# Patient Record
Sex: Male | Born: 1985 | Race: Black or African American | Hispanic: No | Marital: Single | Smoking: Former smoker
Health system: Southern US, Community
[De-identification: ages and names within clinical notes are randomized; demographics above are authoritative.]

## PROBLEM LIST (undated history)

## (undated) DIAGNOSIS — F419 Anxiety disorder, unspecified: Secondary | ICD-10-CM

---
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
## 2004-10-06 ENCOUNTER — Emergency Department (HOSPITAL_COMMUNITY): Admission: EM | Admit: 2004-10-06 | Discharge: 2004-10-06 | Payer: Self-pay | Admitting: *Deleted

## 2005-07-19 ENCOUNTER — Emergency Department (HOSPITAL_COMMUNITY): Admission: EM | Admit: 2005-07-19 | Discharge: 2005-07-20 | Payer: Self-pay | Admitting: Emergency Medicine

## 2006-07-25 ENCOUNTER — Emergency Department (HOSPITAL_COMMUNITY): Admission: EM | Admit: 2006-07-25 | Discharge: 2006-07-25 | Payer: Self-pay | Admitting: Emergency Medicine

## 2007-11-29 ENCOUNTER — Emergency Department (HOSPITAL_COMMUNITY): Admission: EM | Admit: 2007-11-29 | Discharge: 2007-11-29 | Payer: Self-pay | Admitting: Emergency Medicine

## 2008-11-04 ENCOUNTER — Emergency Department (HOSPITAL_COMMUNITY): Admission: EM | Admit: 2008-11-04 | Discharge: 2008-11-04 | Payer: Self-pay | Admitting: Emergency Medicine

## 2008-11-17 ENCOUNTER — Emergency Department (HOSPITAL_COMMUNITY): Admission: EM | Admit: 2008-11-17 | Discharge: 2008-11-17 | Payer: Self-pay | Admitting: Emergency Medicine

## 2009-12-03 ENCOUNTER — Emergency Department (HOSPITAL_COMMUNITY): Admission: EM | Admit: 2009-12-03 | Discharge: 2009-12-03 | Payer: Self-pay | Admitting: Emergency Medicine

## 2011-02-26 LAB — CBC
HCT: 41.9
Hemoglobin: 14.3
MCHC: 34.2
MCV: 84.8
Platelets: 150
RBC: 4.94
RDW: 13.5
WBC: 6.5

## 2011-02-26 LAB — DIFFERENTIAL
Basophils Absolute: 0.1
Basophils Relative: 1
Eosinophils Absolute: 0.1
Eosinophils Relative: 2
Lymphocytes Relative: 27
Lymphs Abs: 1.8
Monocytes Absolute: 0.4
Monocytes Relative: 6
Neutro Abs: 4.1
Neutrophils Relative %: 64

## 2011-02-26 LAB — POCT I-STAT, CHEM 8
BUN: 15
Calcium, Ion: 1.17
Chloride: 106
Creatinine, Ser: 1.1
Glucose, Bld: 95
HCT: 45
Hemoglobin: 15.3
Potassium: 3.5
Sodium: 139
TCO2: 25

## 2012-03-28 ENCOUNTER — Observation Stay (HOSPITAL_COMMUNITY)
Admission: EM | Admit: 2012-03-28 | Discharge: 2012-03-28 | Disposition: A | Discharge status: Home / Self Care | Payer: Self-pay | Attending: Emergency Medicine | Admitting: Emergency Medicine

## 2012-03-28 ENCOUNTER — Encounter (HOSPITAL_COMMUNITY): Payer: Self-pay | Admitting: Cardiology

## 2012-03-28 DIAGNOSIS — L02519 Cutaneous abscess of unspecified hand: Principal | ICD-10-CM | POA: Insufficient documentation

## 2012-03-28 DIAGNOSIS — W269XXA Contact with unspecified sharp object(s), initial encounter: Secondary | ICD-10-CM | POA: Insufficient documentation

## 2012-03-28 DIAGNOSIS — L03019 Cellulitis of unspecified finger: Principal | ICD-10-CM | POA: Insufficient documentation

## 2012-03-28 DIAGNOSIS — Y929 Unspecified place or not applicable: Secondary | ICD-10-CM | POA: Insufficient documentation

## 2012-03-28 DIAGNOSIS — Y93G9 Activity, other involving cooking and grilling: Secondary | ICD-10-CM | POA: Insufficient documentation

## 2012-03-28 DIAGNOSIS — F17200 Nicotine dependence, unspecified, uncomplicated: Secondary | ICD-10-CM | POA: Insufficient documentation

## 2012-03-28 LAB — CBC
MCHC: 33.6 g/dL (ref 30.0–36.0)
Platelets: 189 10*3/uL (ref 150–400)
RDW: 13 % (ref 11.5–15.5)
WBC: 7 10*3/uL (ref 4.0–10.5)

## 2012-03-28 LAB — BASIC METABOLIC PANEL
BUN: 10 mg/dL (ref 6–23)
Chloride: 100 mEq/L (ref 96–112)
GFR calc Af Amer: >90 mL/min (ref >90)
GFR calc non Af Amer: >90 mL/min (ref >90)
Potassium: 3.5 mEq/L (ref 3.5–5.1)
Sodium: 137 mEq/L (ref 135–145)

## 2012-03-28 MED ORDER — SODIUM CHLORIDE 0.9 % IV SOLN
1000.0000 mL | Freq: Once | INTRAVENOUS | Status: AC
  Administered 2012-03-28: 1000 mL via INTRAVENOUS

## 2012-03-28 MED ORDER — HYDROCODONE-ACETAMINOPHEN 5-325 MG PO TABS: 1.0000 | ORAL_TABLET | ORAL | Status: DC | PRN

## 2012-03-28 MED ORDER — SODIUM CHLORIDE 0.9 % IV SOLN
1000.0000 mL | INTRAVENOUS | Status: DC
Start: 2012-03-28 — End: 2012-03-28

## 2012-03-28 MED ORDER — CLINDAMYCIN HCL 300 MG PO CAPS: 300.0000 mg | ORAL_CAPSULE | Freq: Four times a day (QID) | ORAL | Status: AC

## 2012-03-28 MED ORDER — CLINDAMYCIN PHOSPHATE 900 MG/50ML IV SOLN
900.0000 mg | Freq: Three times a day (TID) | INTRAVENOUS | Status: DC
Start: 2012-03-28 — End: 2012-03-28
  Administered 2012-03-28: 900 mg via INTRAVENOUS
  Filled 2012-03-28: qty 50

## 2012-03-28 MED ORDER — MORPHINE SULFATE 4 MG/ML IJ SOLN: 4.0000 mg | INTRAMUSCULAR | Status: DC | PRN

## 2012-03-28 NOTE — ED Notes (Signed)
Reports swelling to the right index finger that started last night. Pt with swelling and redness to the finger.

## 2012-03-28 NOTE — Progress Notes (Signed)
Utilization review completed.  

## 2012-03-28 NOTE — ED Notes (Signed)
Right index finger swollen, red and tender. No known injury. The pain is equal through out finger.

## 2012-03-28 NOTE — ED Provider Notes (Signed)
History    This chart was scribed for Seth Munch, MD, MD by Smitty Pluck. The patient was seen in room TR09C and the patient's care was started at 12:37PM.   CSN: [id number]  Arrival date & time 03/28/12  1151   None     Chief Complaint  Patient presents with  . Hand Pain    (Consider location/radiation/quality/duration/timing/severity/associated sxs/prior treatment) Patient is a 26 y.o. male presenting with hand pain. The history is provided by the patient. No language interpreter was used.  Hand Pain   Seth Davis is a 26 y.o. male who presents to the Emergency Department complaining of constant, moderate right index finger pain onset 3 days ago.  Pt reports cutting his finger while cleaning fish. He states that the pain radiates to other parts of hand. Pt reports having fever 1x but it has subsided. He denies nausea, vomiting and any other pain.      History reviewed. No pertinent past medical history.  History reviewed. No pertinent past surgical history.  History reviewed. No pertinent family history.  History  Substance Use Topics  . Smoking status: Current Some Day Smoker  . Smokeless tobacco: Not on file  . Alcohol Use: Yes      Review of Systems  Constitutional:       Per HPI, otherwise negative  HENT:       Per HPI, otherwise negative  Eyes: Negative.   Respiratory:       Per HPI, otherwise negative  Cardiovascular:       Per HPI, otherwise negative  Gastrointestinal: Negative for vomiting.  Genitourinary: Negative.   Musculoskeletal:       Per HPI, otherwise negative  Skin: Negative.   Neurological: Negative for syncope.    Allergies  Review of patient's allergies indicates no known allergies.  Home Medications   Current Outpatient Rx  Name Route Sig Dispense Refill  . ACETAMINOPHEN 500 MG PO TABS Oral Take 1,000 mg by mouth every 6 (six) hours as needed. For pain      BP 115/81  Pulse 83  Temp 98.3 F (36.8 C)  Resp 18  SpO2  97%  Physical Exam  Nursing note and vitals reviewed. Constitutional: He is oriented to person, place, and time. He appears well-developed and well-nourished. No distress.  HENT:  Head: Normocephalic and atraumatic.  Eyes: EOM are normal.  Neck: Neck supple. No tracheal deviation present.  Cardiovascular: Normal rate.   Pulmonary/Chest: Effort normal. No respiratory distress.  Musculoskeletal: Normal range of motion.       Pain on middle phalanx with less on proximal No drainage  Flex and extension of distal phalanx minimaly Pain on dorsal phalanx   Neurological: He is alert and oriented to person, place, and time.  Skin: Skin is warm and dry.  Psychiatric: He has a normal mood and affect. His behavior is normal.    ED Course  Procedures (including critical care time)  COORDINATION OF CARE: 1:01 PM Discussed ED treatment with pt     Labs Reviewed - No data to display No results found.   No diagnosis found.    MDM  I personally performed the services described in this documentation, which was scribed in my presence. The recorded information has been reviewed and considered.  This previously well young male presents several days after sustaining a laceration to the right index finger.  On exam the patient is in no distress.  The patient's flexion and extension capacity of all  joints on the affected digit, as well as the remainder of his hand.  There is edema, erythema, no tenderness the patient proximal to the digit, which is reassuring for the lack of current tendonous sheath involvement. Given the location of the wound, the patient was placed in the CDU under the cellulitis protocol.  Expectation is for reassessment after antibiotics with consideration of either ED hand consult for a lack of improvement or discharge with hand surgery follow-up if he improves, as anticipated.  Seth Munch, MD 03/28/12 [phone]

## 2012-03-28 NOTE — ED Provider Notes (Signed)
2:10 PM Pt here with swelling and pain to the right index finger starting 2 days ago. States has a small scratch to the top of the finger, but no other injuries. Denies fever, chills. Able to move finger all the way, with pain. Swelling extends over entire finger.   Filed Vitals:   03/28/12 1356  BP: 123/78  Pulse: 71  Temp: 98.3 F (36.8 C)  Resp: 20   Exam: Pt in NAD. AAOx3. PERRLA. Neck is supple. Regular HR and rhythm. Lungs are clear to auscultation bilaterally. Swelling noted to the right index finger, it is erythemous, tender over all phalanxes. NO tenderness to the MCP joint or the hand.   Pt has fluids, and clindamycin ordered. Plan to watch for 8 hrs, d/c home with oral antibiotics if improving. Pt is non toxic.   3:39 PM Pt signed out to the next provider at shift change.    Lottie Mussel, PA 03/28/12 1540

## 2012-03-28 NOTE — ED Provider Notes (Signed)
Please see my initial note.  I transfered the patient to the CDU under the cellulitis protocol.  Gerhard Munch, MD 03/28/12 [phone]

## 2012-03-28 NOTE — ED Provider Notes (Signed)
4:33 PM Patient is in CDU under observation, cellulitis protocol.  He has received his first dose of clindamycin at 2:00pm today.  Pt reports his finger is "about the same" but notes that he can move it much more now, and that it definitely is not getting worse.  I discussed with him the plan to stay for second dose of antibiotics around 10pm and he states he would much prefer to go home, does not want to stay.  Pt does not have health insurance, may need help filling clindamycin.  I have paged the case manager to see if he qualifies for the indigent fund.  Right index finger is erythematous, warm, tender to palpation.  He is able to bend his finger at each joint and reports greater ROM than before.  He does not have any spread past the base of his finger.  Will attempt to help get prescription filled, if pt continues to do well, may d/c prior to second dose per his request.    6:50 PM Patient does qualify for indigent fund with pharmacy, prescription to be filled downstairs prior to discharge home.  Pt continues to desire this plan, does not want to stay for second IV dose.    7:56 PM Pt showing improvement in ROM of finger, no worsening of appearance of finger or in patient's symptoms.  Pt continues to want to be discharged.  Meds received from pharmacy.  Pt informed of need for follow up in 2 days, will give hand surgery referral.  Pt informed he should come here if he is unable to see hand specialist for recheck.  Discussed return precautions.  Pt verbalizes understanding and agrees with plan.    Results for orders placed during the hospital encounter of 03/28/12  CBC      Component Value Range   WBC 7.0  4.0 - 10.5 K/uL   RBC 5.16  4.22 - 5.81 MIL/uL   Hemoglobin 14.7  13.0 - 17.0 g/dL   HCT 96.0  45.4 - 09.8 %   MCV 84.7  78.0 - 100.0 fL   MCH 28.5  26.0 - 34.0 pg   MCHC 33.6  30.0 - 36.0 g/dL   RDW 11.9  14.7 - 82.9 %   Platelets 189  150 - 400 K/uL  BASIC METABOLIC PANEL      Component  Value Range   Sodium 137  135 - 145 mEq/L   Potassium 3.5  3.5 - 5.1 mEq/L   Chloride 100  96 - 112 mEq/L   CO2 25  19 - 32 mEq/L   Glucose, Bld 135 (*) 70 - 99 mg/dL   BUN 10  6 - 23 mg/dL   Creatinine, Ser 5.62  0.50 - 1.35 mg/dL   Calcium 9.2  8.4 - 13.0 mg/dL   GFR calc non Af Amer >90  >90 mL/min   GFR calc Af Amer >90  >90 mL/min   No results found.    [place], [place] 03/28/12 [phone]

## 2012-03-29 NOTE — Care Management Note (Signed)
    Page 1 of 1   03/29/2012     8:26:57 AM   CARE MANAGEMENT NOTE 03/29/2012  Patient:  Seth Davis, Seth Davis   Account Number:  [id number]  Date Initiated:  03/29/2012  Documentation initiated by:  Jim Like  Subjective/Objective Assessment:     Action/Plan:   Medication assistance   Anticipated DC Date:  03/28/2012   Anticipated DC Plan:  HOME/SELF CARE      DC Planning Services  Medication Assistance      Choice offered to / List presented to:             Status of service:   Medicare Important Message given?   (If response is "NO", the following Medicare IM given date fields will be blank) Date Medicare IM given:   Date Additional Medicare IM given:    Discharge Disposition:    Per UR Regulation:    If discussed at Long Length of Stay Meetings, dates discussed:    Comments:  03/28/12 8:20 Call from Fernley PA in the ED requesting medication assistance for Clindamycin 300 mg po.  Per pharmacy pt is eligible for [facility] fund assistance.  Jim Like RN CCM [facility].

## 2012-03-29 NOTE — ED Provider Notes (Signed)
I was available during the early portion of the patient's CDU course. Chest to the CDU with concern for cellulitis due to finger infection.    Gerhard Munch, MD 03/29/12 [phone]

## 2012-04-11 ENCOUNTER — Emergency Department (HOSPITAL_COMMUNITY)
Admission: EM | Admit: 2012-04-11 | Discharge: 2012-04-11 | Disposition: A | Discharge status: Home / Self Care | Payer: Self-pay | Attending: Emergency Medicine | Admitting: Emergency Medicine

## 2012-04-11 ENCOUNTER — Encounter (HOSPITAL_COMMUNITY): Payer: Self-pay | Admitting: Family Medicine

## 2012-04-11 DIAGNOSIS — R197 Diarrhea, unspecified: Secondary | ICD-10-CM | POA: Insufficient documentation

## 2012-04-11 DIAGNOSIS — R109 Unspecified abdominal pain: Secondary | ICD-10-CM

## 2012-04-11 DIAGNOSIS — F17200 Nicotine dependence, unspecified, uncomplicated: Secondary | ICD-10-CM | POA: Insufficient documentation

## 2012-04-11 DIAGNOSIS — Z79899 Other long term (current) drug therapy: Secondary | ICD-10-CM | POA: Insufficient documentation

## 2012-04-11 DIAGNOSIS — R1084 Generalized abdominal pain: Secondary | ICD-10-CM | POA: Insufficient documentation

## 2012-04-11 LAB — CBC WITH DIFFERENTIAL/PLATELET
Basophils Absolute: 0 10*3/uL (ref 0.0–0.1)
Basophils Relative: 0 % (ref 0–1)
HCT: 45.8 % (ref 39.0–52.0)
Hemoglobin: 15.4 g/dL (ref 13.0–17.0)
Lymphocytes Relative: 30 % (ref 12–46)
MCHC: 33.6 g/dL (ref 30.0–36.0)
Monocytes Absolute: 0.3 10*3/uL (ref 0.1–1.0)
Monocytes Relative: 4 % (ref 3–12)
Neutro Abs: 3.9 10*3/uL (ref 1.7–7.7)
Neutrophils Relative %: 62 % (ref 43–77)
WBC: 6.3 10*3/uL (ref 4.0–10.5)

## 2012-04-11 LAB — URINALYSIS, ROUTINE W REFLEX MICROSCOPIC
Bilirubin Urine: NEGATIVE
Glucose, UA: NEGATIVE mg/dL
Ketones, ur: NEGATIVE mg/dL
Protein, ur: NEGATIVE mg/dL
pH: 5.5 (ref 5.0–8.0)

## 2012-04-11 LAB — COMPREHENSIVE METABOLIC PANEL
AST: 22 U/L (ref 0–37)
Albumin: 4 g/dL (ref 3.5–5.2)
Alkaline Phosphatase: 43 U/L (ref 39–117)
BUN: 9 mg/dL (ref 6–23)
CO2: 28 mEq/L (ref 19–32)
Chloride: 101 mEq/L (ref 96–112)
GFR calc non Af Amer: >90 mL/min (ref >90)
Potassium: 4.5 mEq/L (ref 3.5–5.1)
Total Bilirubin: 0.4 mg/dL (ref 0.3–1.2)

## 2012-04-11 MED ORDER — LOPERAMIDE HCL 2 MG PO CAPS
4.0000 mg | ORAL_CAPSULE | Freq: Once | ORAL | Status: AC
  Administered 2012-04-11: 4 mg via ORAL
  Filled 2012-04-11: qty 2

## 2012-04-11 MED ORDER — METRONIDAZOLE 500 MG PO TABS: 500.0000 mg | ORAL_TABLET | Freq: Three times a day (TID) | ORAL | Status: DC

## 2012-04-11 MED ORDER — METOCLOPRAMIDE HCL 10 MG PO TABS: 10.0000 mg | ORAL_TABLET | Freq: Four times a day (QID) | ORAL | Status: DC | PRN

## 2012-04-11 MED ORDER — METRONIDAZOLE 500 MG PO TABS
500.0000 mg | ORAL_TABLET | Freq: Once | ORAL | Status: AC
  Administered 2012-04-11: 500 mg via ORAL
  Filled 2012-04-11: qty 1

## 2012-04-11 NOTE — ED Notes (Signed)
Per pt 3 days of mid abdominal pain with diarrhea and nausea. sts watery and has went about 7 times.

## 2012-04-11 NOTE — ED Provider Notes (Signed)
History     CSN: [id number]  Arrival date & time 04/11/12  1337   First MD Initiated Contact with Patient 04/11/12 1739      Chief Complaint  Patient presents with  . Abdominal Pain    (Consider location/radiation/quality/duration/timing/severity/associated sxs/prior treatment) Patient is a 26 y.o. male presenting with abdominal pain. The history is provided by the patient.  Abdominal Pain The primary symptoms of the illness include abdominal pain.  He has been having crampy abdominal pain for about the last 2 weeks. Pain is generalized and not affected by anything that he does. He started having diarrhea today and pain is not better or worse with diarrhea. There has been associated nausea without vomiting. He denies fever, chills, sweats. Note, he was in the CDU here for cellulitis of his finger and it and sent home on clindamycin and he also states that he had been given a prescription for amoxicillin about 2 months ago. He is taking over-the-counter analgesics with no relief.   History reviewed. No pertinent past medical history.  History reviewed. No pertinent past surgical history.  History reviewed. No pertinent family history.  History  Substance Use Topics  . Smoking status: Current Some Day Smoker  . Smokeless tobacco: Not on file  . Alcohol Use: Yes      Review of Systems  Gastrointestinal: Positive for abdominal pain.  All other systems reviewed and are negative.    Allergies  Review of patient's allergies indicates no known allergies.  Home Medications   Current Outpatient Rx  Name  Route  Sig  Dispense  Refill  . HYDROCODONE-ACETAMINOPHEN 5-325 MG PO TABS   Oral   Take 1-2 tablets by mouth every 4 (four) hours as needed for pain.   20 tablet   0   . METOCLOPRAMIDE HCL 10 MG PO TABS   Oral   Take 1 tablet (10 mg total) by mouth every 6 (six) hours as needed (nausea).   30 tablet   0   . METRONIDAZOLE 500 MG PO TABS   Oral   Take 1 tablet (500  mg total) by mouth 3 (three) times daily.   30 tablet   0     BP 126/66  Pulse 61  Temp 97.9 F (36.6 C)  Resp 18  SpO2 97%  Physical Exam  Nursing note and vitals reviewed. 26 year old male, resting comfortably and in no acute distress. Vital signs are normal. Oxygen saturation is 99%, which is normal. Head is normocephalic and atraumatic. PERRLA, EOMI. Oropharynx is clear. Neck is nontender and supple without adenopathy or JVD. Back is nontender and there is no CVA tenderness. Lungs are clear without rales, wheezes, or rhonchi. Chest is nontender. Heart has regular rate and rhythm without murmur. Abdomen is soft, flat, nontender without masses or hepatosplenomegaly and peristalsis is normoactive. Extremities have no cyanosis or edema, full range of motion is present. Skin is warm and dry without rash. Neurologic: Mental status is normal, cranial nerves are intact, there are no motor or sensory deficits.   ED Course  Procedures (including critical care time)  Results for orders placed during the hospital encounter of 04/11/12  CBC WITH DIFFERENTIAL      Component Value Range   WBC 6.3  4.0 - 10.5 K/uL   RBC 5.46  4.22 - 5.81 MIL/uL   Hemoglobin 15.4  13.0 - 17.0 g/dL   HCT 28.4  13.2 - 44.0 %   MCV 83.9  78.0 - 100.0 fL  MCH 28.2  26.0 - 34.0 pg   MCHC 33.6  30.0 - 36.0 g/dL   RDW 95.6  21.3 - 08.6 %   Platelets 209  150 - 400 K/uL   Neutrophils Relative 62  43 - 77 %   Neutro Abs 3.9  1.7 - 7.7 K/uL   Lymphocytes Relative 30  12 - 46 %   Lymphs Abs 1.9  0.7 - 4.0 K/uL   Monocytes Relative 4  3 - 12 %   Monocytes Absolute 0.3  0.1 - 1.0 K/uL   Eosinophils Relative 4  0 - 5 %   Eosinophils Absolute 0.3  0.0 - 0.7 K/uL   Basophils Relative 0  0 - 1 %   Basophils Absolute 0.0  0.0 - 0.1 K/uL  COMPREHENSIVE METABOLIC PANEL      Component Value Range   Sodium 139  135 - 145 mEq/L   Potassium 4.5  3.5 - 5.1 mEq/L   Chloride 101  96 - 112 mEq/L   CO2 28  19 - 32  mEq/L   Glucose, Bld 87  70 - 99 mg/dL   BUN 9  6 - 23 mg/dL   Creatinine, Ser 5.78  0.50 - 1.35 mg/dL   Calcium 9.9  8.4 - 46.9 mg/dL   Total Protein 7.4  6.0 - 8.3 g/dL   Albumin 4.0  3.5 - 5.2 g/dL   AST 22  0 - 37 U/L   ALT 14  0 - 53 U/L   Alkaline Phosphatase 43  39 - 117 U/L   Total Bilirubin 0.4  0.3 - 1.2 mg/dL   GFR calc non Af Amer >90  >90 mL/min   GFR calc Af Amer >90  >90 mL/min  LIPASE, BLOOD      Component Value Range   Lipase 21  11 - 59 U/L  URINALYSIS, ROUTINE W REFLEX MICROSCOPIC      Component Value Range   Color, Urine YELLOW  YELLOW   APPearance CLEAR  CLEAR   Specific Gravity, Urine 1.019  1.005 - 1.030   pH 5.5  5.0 - 8.0   Glucose, UA NEGATIVE  NEGATIVE mg/dL   Hgb urine dipstick NEGATIVE  NEGATIVE   Bilirubin Urine NEGATIVE  NEGATIVE   Ketones, ur NEGATIVE  NEGATIVE mg/dL   Protein, ur NEGATIVE  NEGATIVE mg/dL   Urobilinogen, UA 0.2  0.0 - 1.0 mg/dL   Nitrite NEGATIVE  NEGATIVE   Leukocytes, UA NEGATIVE  NEGATIVE     1. Abdominal cramping   2. Diarrhea       MDM  Crampy abdominal pain and diarrhea following 2 courses of antibiotics. Exam is benign and WBC is normal. This is most likely antibiotic associated diarrhea, possibly Clostridium difficile. He will be treated with metronidazole and also given a prescription for metoclopramide for nausea. He is to return should symptoms worsen.         Dione Booze, MD 04/11/12 [phone]

## 2012-04-11 NOTE — ED Notes (Signed)
Pt reports abdominal pain/cramping x 3 wks. States that diarrhea has been intermittent x 1 week and has gotten worse today. States that abdominal pain is 8/10 when cramping starts. Reports nausea with no vomiting. Pt reports headache and back pain with abdominal pain. Pt denies dysuria or penile discharge or odor.

## 2012-08-18 ENCOUNTER — Emergency Department (HOSPITAL_COMMUNITY): Payer: Self-pay

## 2012-08-18 ENCOUNTER — Encounter (HOSPITAL_COMMUNITY): Payer: Self-pay | Admitting: Emergency Medicine

## 2012-08-18 ENCOUNTER — Emergency Department (HOSPITAL_COMMUNITY)
Admission: EM | Admit: 2012-08-18 | Discharge: 2012-08-18 | Disposition: A | Discharge status: Home / Self Care | Payer: Self-pay | Attending: Emergency Medicine | Admitting: Emergency Medicine

## 2012-08-18 DIAGNOSIS — Z8659 Personal history of other mental and behavioral disorders: Secondary | ICD-10-CM | POA: Insufficient documentation

## 2012-08-18 DIAGNOSIS — R51 Headache: Secondary | ICD-10-CM | POA: Insufficient documentation

## 2012-08-18 DIAGNOSIS — F17200 Nicotine dependence, unspecified, uncomplicated: Secondary | ICD-10-CM | POA: Insufficient documentation

## 2012-08-18 DIAGNOSIS — R071 Chest pain on breathing: Secondary | ICD-10-CM | POA: Insufficient documentation

## 2012-08-18 HISTORY — DX: Anxiety disorder, unspecified: F41.9

## 2012-08-18 LAB — CBC
HCT: 41 % (ref 39.0–52.0)
Hemoglobin: 14 g/dL (ref 13.0–17.0)
MCH: 28.4 pg (ref 26.0–34.0)
MCV: 83.2 fL (ref 78.0–100.0)
RBC: 4.93 MIL/uL (ref 4.22–5.81)
WBC: 5.5 10*3/uL (ref 4.0–10.5)

## 2012-08-18 LAB — BASIC METABOLIC PANEL
CO2: 28 mEq/L (ref 19–32)
Chloride: 105 mEq/L (ref 96–112)
Glucose, Bld: 92 mg/dL (ref 70–99)
Sodium: 141 mEq/L (ref 135–145)

## 2012-08-18 MED ORDER — DIPHENHYDRAMINE HCL 50 MG/ML IJ SOLN
50.0000 mg | Freq: Once | INTRAMUSCULAR | Status: AC
  Administered 2012-08-18: 50 mg via INTRAMUSCULAR
  Filled 2012-08-18: qty 1

## 2012-08-18 MED ORDER — KETOROLAC TROMETHAMINE 60 MG/2ML IM SOLN
60.0000 mg | Freq: Once | INTRAMUSCULAR | Status: AC
  Administered 2012-08-18: 60 mg via INTRAMUSCULAR
  Filled 2012-08-18: qty 2

## 2012-08-18 MED ORDER — METOCLOPRAMIDE HCL 5 MG/ML IJ SOLN
10.0000 mg | Freq: Once | INTRAMUSCULAR | Status: AC
  Administered 2012-08-18: 10 mg via INTRAMUSCULAR
  Filled 2012-08-18: qty 2

## 2012-08-18 NOTE — ED Notes (Signed)
Pt comes up to Nurse First and reports he was asleep and did not hear his named called.  Pt reports he is going out to his car but will be back.

## 2012-08-18 NOTE — ED Notes (Signed)
Pt called for room x3 no answer 

## 2012-08-18 NOTE — ED Notes (Signed)
Pt reports headaches and dizziness for the last 3-4days. Pt reports pain is intermittent and has increased in frequency. Pt states today he developed throbbing pain in his substernal chest with some mild SOB and decided to come to the ER. Pt A&Ox4. VSS. NAD.

## 2012-08-18 NOTE — ED Provider Notes (Signed)
History     CSN: [id number]  Arrival date & time 08/18/12  1114   First MD Initiated Contact with Patient 08/18/12 1506      Chief Complaint  Patient presents with  . Chest Pain  . Headache     HPI Pt was seen at 1520.   Per pt, c/o gradual onset and persistence of constant left sided "headache" that began 3 to 4 days ago.  Describes the headache as "throbbing."  Has been associated with sinus congestion.  Pt also c/o gradual onset and resolution of one brief episode of chest "pain" that woke him up from sleep this morning at 0700.  Pt states it lasted only a few minutes before resolving.  Denies palpitations, no SOB/cough, no abd pain, no N/V/D, no back pain, no visual changes, no focal motor weakness, no tingling/numbness in extremities, no fevers, no rash. Denies headache was sudden or maximal at onset or at any time.    Past Medical History  Diagnosis Date  . Anxiety     History reviewed. No pertinent past surgical history.   History  Substance Use Topics  . Smoking status: Current Some Day Smoker  . Smokeless tobacco: Not on file  . Alcohol Use: Yes    Review of Systems ROS: Statement: All systems negative except as marked or noted in the HPI; Constitutional: Negative for fever and chills. ; ; Eyes: Negative for eye pain, redness and discharge. ; ; ENMT: Negative for ear pain, hoarseness, nasal congestion, sore throat. +sinus pressure. ; ; Cardiovascular: +CP. Negative for palpitations, diaphoresis, dyspnea and peripheral edema. ; ; Respiratory: Negative for cough, wheezing and stridor. ; ; Gastrointestinal: Negative for nausea, vomiting, diarrhea, abdominal pain, blood in stool, hematemesis, jaundice and rectal bleeding. . ; ; Genitourinary: Negative for dysuria, flank pain and hematuria. ; ; Musculoskeletal: Negative for back pain and neck pain. Negative for swelling and trauma.; ; Skin: Negative for pruritus, rash, abrasions, blisters, bruising and skin lesion.; ; Neuro:  +headache. Negative for lightheadedness and neck stiffness. Negative for weakness, altered level of consciousness , altered mental status, extremity weakness, paresthesias, involuntary movement, seizure and syncope.       Allergies  Review of patient's allergies indicates no known allergies.  Home Medications   Current Outpatient Rx  Name  Route  Sig  Dispense  Refill  . ibuprofen (ADVIL,MOTRIN) 200 MG tablet   Oral   Take 800 mg by mouth every 6 (six) hours as needed for pain.           BP 117/66  Pulse 60  Temp(Src) 97.8 F (36.6 C) (Oral)  Resp 16  Ht 5\' 10"  (1.778 m)  Wt 150 lb (68.04 kg)  BMI 21.52 kg/m2  SpO2 100%  Physical Exam 1525: Physical examination:  Nursing notes reviewed; Vital signs and O2 SAT reviewed;  Constitutional: Well developed, Well nourished, Well hydrated, In no acute distress; Head:  Normocephalic, atraumatic; Eyes: EOMI, PERRL, No scleral icterus; ENMT: TM's clear bilat. +edemetous nasal turbinates bilat with clear rhinorrhea.  Mouth and pharynx normal, Mucous membranes moist; Neck: Supple, Full range of motion, No lymphadenopathy; Cardiovascular: Regular rate and rhythm, No murmur, rub, or gallop; Respiratory: Breath sounds clear & equal bilaterally, No rales, rhonchi, wheezes.  Speaking full sentences with ease, Normal respiratory effort/excursion; Chest: Nontender, Movement normal; Abdomen: Soft, Nontender, Nondistended, Normal bowel sounds;; Extremities: Pulses normal, No tenderness, No edema, No calf edema or asymmetry.; Neuro: AA&Ox3, Major CN grossly intact.  Speech clear. No gross  focal motor or sensory deficits in extremities. Climbs on and off stretcher easily by himself. Gait steady.; Skin: Color normal, Warm, Dry.Psych:  Anxious.    ED Course  Procedures     MDM  MDM Reviewed: previous chart, nursing note and vitals Interpretation: labs, ECG, x-ray and CT scan    Date: 08/18/2012  Rate: 73  Rhythm: normal sinus rhythm and sinus  arrhythmia  QRS Axis: normal  Intervals: normal  ST/T Wave abnormalities: normal  Conduction Disutrbances:none  Narrative Interpretation:   Old EKG Reviewed: none available  Results for orders placed during the hospital encounter of 08/18/12  BASIC METABOLIC PANEL      Result Value Range   Sodium 141  135 - 145 mEq/L   Potassium 3.7  3.5 - 5.1 mEq/L   Chloride 105  96 - 112 mEq/L   CO2 28  19 - 32 mEq/L   Glucose, Bld 92  70 - 99 mg/dL   BUN 8  6 - 23 mg/dL   Creatinine, Ser 4.09  0.50 - 1.35 mg/dL   Calcium 9.3  8.4 - 81.1 mg/dL   GFR calc non Af Amer >90  >90 mL/min   GFR calc Af Amer >90  >90 mL/min  CBC      Result Value Range   WBC 5.5  4.0 - 10.5 K/uL   RBC 4.93  4.22 - 5.81 MIL/uL   Hemoglobin 14.0  13.0 - 17.0 g/dL   HCT 91.4  78.2 - 95.6 %   MCV 83.2  78.0 - 100.0 fL   MCH 28.4  26.0 - 34.0 pg   MCHC 34.1  30.0 - 36.0 g/dL   RDW 21.3  08.6 - 57.8 %   Platelets 174  150 - 400 K/uL  POCT I-STAT TROPONIN I      Result Value Range   Troponin i, poc 0.01  0.00 - 0.08 ng/mL   Comment 3            Dg Chest 2 View 08/18/2012  *RADIOLOGY REPORT*  Clinical Data: Chest pain and pressure.  CHEST - 2 VIEW  Comparison: 11/29/2007  Findings: Normal heart, mediastinal, and hilar contours.  Normal pulmonary vascularity.  The lungs are well expanded and clear. Negative for pleural effusion.  No acute osseous abnormality identified.  IMPRESSION: No acute cardiopulmonary disease.   Original Report Authenticated By: Britta Mccreedy, M.D.   Ct Head Wo Contrast 08/18/2012  *RADIOLOGY REPORT*  Clinical Data: Headaches and dizziness.  CT HEAD WITHOUT CONTRAST  Technique:  Contiguous axial images were obtained from the base of the skull through the vertex without contrast.  Comparison: None.  Findings: No acute infarct, hemorrhage, or mass lesion is present. The ventricles are normal size.  No significant extra-axial fluid collection is present.  The paranasal sinuses and mastoid air cells are  clear.  The osseous skull is intact.  IMPRESSION: Negative CT of the head.   Original Report Authenticated By: Marin Roberts, M.D.     [phone]:  Pt states he feels "much better now" and wants to go home.  Has gotten himself dressed and is sitting off the side of the stretcher. No red flags; will tx symptomatically at this time. Doubt PE as cause for CP given low risk Wells. Doubt ACS with atypical symptoms and only a few minutes of pain, with normal troponin and EKG 8 hours after symptoms. Dx and testing d/w pt.  Questions answered.  Verb understanding, agreeable to d/c home with outpt f/u.  Laray Anger, DO 08/21/12 [phone]

## 2013-01-12 ENCOUNTER — Encounter (HOSPITAL_COMMUNITY): Payer: Self-pay | Admitting: Emergency Medicine

## 2013-01-12 ENCOUNTER — Emergency Department (HOSPITAL_COMMUNITY): Payer: Self-pay

## 2013-01-12 ENCOUNTER — Emergency Department (HOSPITAL_COMMUNITY)
Admission: EM | Admit: 2013-01-12 | Discharge: 2013-01-12 | Disposition: A | Discharge status: Home / Self Care | Payer: Self-pay | Attending: Emergency Medicine | Admitting: Emergency Medicine

## 2013-01-12 DIAGNOSIS — Y92410 Unspecified street and highway as the place of occurrence of the external cause: Secondary | ICD-10-CM | POA: Insufficient documentation

## 2013-01-12 DIAGNOSIS — Z8659 Personal history of other mental and behavioral disorders: Secondary | ICD-10-CM | POA: Insufficient documentation

## 2013-01-12 DIAGNOSIS — IMO0002 Reserved for concepts with insufficient information to code with codable children: Secondary | ICD-10-CM | POA: Insufficient documentation

## 2013-01-12 DIAGNOSIS — Z87891 Personal history of nicotine dependence: Secondary | ICD-10-CM | POA: Insufficient documentation

## 2013-01-12 DIAGNOSIS — S20229A Contusion of unspecified back wall of thorax, initial encounter: Secondary | ICD-10-CM

## 2013-01-12 DIAGNOSIS — M62830 Muscle spasm of back: Secondary | ICD-10-CM

## 2013-01-12 DIAGNOSIS — M5380 Other specified dorsopathies, site unspecified: Secondary | ICD-10-CM | POA: Insufficient documentation

## 2013-01-12 DIAGNOSIS — Y9389 Activity, other specified: Secondary | ICD-10-CM | POA: Insufficient documentation

## 2013-01-12 MED ORDER — OXYCODONE-ACETAMINOPHEN 5-325 MG PO TABS: 1.0000 | ORAL_TABLET | ORAL | Status: DC | PRN

## 2013-01-12 MED ORDER — CYCLOBENZAPRINE HCL 10 MG PO TABS
10.0000 mg | ORAL_TABLET | Freq: Once | ORAL | Status: AC
  Administered 2013-01-12: 10 mg via ORAL
  Filled 2013-01-12: qty 1

## 2013-01-12 MED ORDER — OXYCODONE-ACETAMINOPHEN 5-325 MG PO TABS
1.0000 | ORAL_TABLET | Freq: Once | ORAL | Status: AC
  Administered 2013-01-12: 1 via ORAL
  Filled 2013-01-12: qty 1

## 2013-01-12 MED ORDER — CYCLOBENZAPRINE HCL 10 MG PO TABS: 10.0000 mg | ORAL_TABLET | Freq: Three times a day (TID) | ORAL | Status: DC | PRN
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
## 2013-02-06 ENCOUNTER — Encounter (HOSPITAL_COMMUNITY): Payer: Self-pay | Admitting: *Deleted

## 2013-02-06 ENCOUNTER — Emergency Department (HOSPITAL_COMMUNITY)
Admission: EM | Admit: 2013-02-06 | Discharge: 2013-02-06 | Disposition: A | Discharge status: Home / Self Care | Payer: Self-pay | Attending: Emergency Medicine | Admitting: Emergency Medicine

## 2013-02-06 DIAGNOSIS — F411 Generalized anxiety disorder: Secondary | ICD-10-CM | POA: Insufficient documentation

## 2013-02-06 DIAGNOSIS — Z79899 Other long term (current) drug therapy: Secondary | ICD-10-CM | POA: Insufficient documentation

## 2013-02-06 DIAGNOSIS — M25579 Pain in unspecified ankle and joints of unspecified foot: Secondary | ICD-10-CM | POA: Insufficient documentation

## 2013-02-06 DIAGNOSIS — L84 Corns and callosities: Secondary | ICD-10-CM

## 2013-02-06 DIAGNOSIS — Z87891 Personal history of nicotine dependence: Secondary | ICD-10-CM | POA: Insufficient documentation

## 2013-02-06 DIAGNOSIS — M79671 Pain in right foot: Secondary | ICD-10-CM

## 2013-02-06 MED ORDER — TRAMADOL HCL 50 MG PO TABS: 50.0000 mg | ORAL_TABLET | Freq: Four times a day (QID) | ORAL | Status: DC | PRN

## 2013-02-06 MED ORDER — IBUPROFEN 800 MG PO TABS: 800.0000 mg | ORAL_TABLET | Freq: Three times a day (TID) | ORAL | Status: DC

## 2013-02-06 NOTE — ED Notes (Signed)
C/o bi-lateral feet pain for one year

## 2013-02-06 NOTE — ED Provider Notes (Signed)
CSN: [id number]     Arrival date & time 02/06/13  0138 History   First MD Initiated Contact with Patient 02/06/13 0144     Chief Complaint  Patient presents with  . feet pain    (Consider location/radiation/quality/duration/timing/severity/associated sxs/prior Treatment) HPI This patient is a generally healthy young man who presents with complaints of bilateral foot pain. He points out multiple corns and calluses and says he has had these painful lesions for as long as he can remember. He has tried changing shoes and wearing OTC orthotics. But, this has not been helpful. His pain is aching and burning and localizes to the site of lesions. No recent trauma. Pain is severe with walking. Relieved by rest  Past Medical History  Diagnosis Date  . Anxiety    History reviewed. No pertinent past surgical history. No family history on file. History  Substance Use Topics  . Smoking status: Former Smoker    Quit date: 12/29/2012  . Smokeless tobacco: Not on file  . Alcohol Use: Yes    Review of Systems 10 point review of systems obtained and is negative with the exception of the symptoms noted above.  Allergies  Review of patient's allergies indicates no known allergies.  Home Medications   Current Outpatient Rx  Name  Route  Sig  Dispense  Refill  . acetaminophen (TYLENOL) 325 MG tablet   Oral   Take 650 mg by mouth every 6 (six) hours as needed for pain.         . cyclobenzaprine (FLEXERIL) 10 MG tablet   Oral   Take 1 tablet (10 mg total) by mouth 3 (three) times daily as needed for muscle spasms.   30 tablet   0   . ibuprofen (ADVIL,MOTRIN) 200 MG tablet   Oral   Take 800 mg by mouth every 6 (six) hours as needed for pain.         Marland Kitchen oxyCODONE-acetaminophen (PERCOCET/ROXICET) 5-325 MG per tablet   Oral   Take 1 tablet by mouth every 4 (four) hours as needed for pain.   20 tablet   0   . ibuprofen (ADVIL,MOTRIN) 800 MG tablet   Oral   Take 1 tablet (800 mg total) by  mouth 3 (three) times daily.   21 tablet   0   . traMADol (ULTRAM) 50 MG tablet   Oral   Take 1 tablet (50 mg total) by mouth every 6 (six) hours as needed for pain.   15 tablet   0    BP 138/81  Temp(Src) 97.9 F (36.6 C) (Oral)  Resp 18  SpO2 98% Physical Exam Gen: well developed and well nourished appearing Head: NCAT Eyes: PERL, EOMI Nose: Alternate Mouth lint normal to inspection Neck: Normal to inspection Lungs: CTA B, no wheezing, rhonchi or rales CV: RRR Abd: Normal to inspection Skin: no rashese, wnl Neuro: CN ii-xii grossly intact, no focal deficits Ext: on both feet there are multiple calluses over the toes, there is hyperflexion of the IP joints of toes 2-5 on both feet, large corn present at base of the 3rd metatarsal on left. No open lesions, ttp over the toes. Feet are nontender. FROM.  Psyche; normal affect,  calm and cooperative.   ED Course  Procedures (including critical care time)  Patient educated re: diagnosis. I have counseled to use gentle soaks followed by massage with pumice stone. We have referred the patient to [facility] for further care.   MDM  1. Foot pain, bilateral   2. Corns and callus        Brandt Loosen, MD 02/06/13 [phone]

## 2013-06-22 ENCOUNTER — Encounter (HOSPITAL_COMMUNITY): Payer: Self-pay | Admitting: Emergency Medicine

## 2013-06-22 ENCOUNTER — Emergency Department (HOSPITAL_COMMUNITY)
Admission: EM | Admit: 2013-06-22 | Discharge: 2013-06-22 | Disposition: A | Discharge status: Home / Self Care | Payer: Self-pay | Attending: Emergency Medicine | Admitting: Emergency Medicine

## 2013-06-22 ENCOUNTER — Emergency Department (INDEPENDENT_AMBULATORY_CARE_PROVIDER_SITE_OTHER)
Admission: EM | Admit: 2013-06-22 | Discharge: 2013-06-22 | Disposition: A | Discharge status: Home / Self Care | Payer: Self-pay | Source: Home / Self Care | Attending: Emergency Medicine | Admitting: Emergency Medicine

## 2013-06-22 ENCOUNTER — Other Ambulatory Visit (HOSPITAL_COMMUNITY)
Admission: RE | Admit: 2013-06-22 | Discharge: 2013-06-22 | Disposition: A | Discharge status: Home / Self Care | Payer: Self-pay | Source: Ambulatory Visit | Attending: Emergency Medicine | Admitting: Emergency Medicine

## 2013-06-22 ENCOUNTER — Emergency Department (HOSPITAL_COMMUNITY): Payer: Self-pay

## 2013-06-22 DIAGNOSIS — N50811 Right testicular pain: Secondary | ICD-10-CM

## 2013-06-22 DIAGNOSIS — N50819 Testicular pain, unspecified: Secondary | ICD-10-CM

## 2013-06-22 DIAGNOSIS — Z113 Encounter for screening for infections with a predominantly sexual mode of transmission: Secondary | ICD-10-CM | POA: Insufficient documentation

## 2013-06-22 DIAGNOSIS — N509 Disorder of male genital organs, unspecified: Secondary | ICD-10-CM

## 2013-06-22 DIAGNOSIS — Z87891 Personal history of nicotine dependence: Secondary | ICD-10-CM | POA: Insufficient documentation

## 2013-06-22 DIAGNOSIS — Z8659 Personal history of other mental and behavioral disorders: Secondary | ICD-10-CM | POA: Insufficient documentation

## 2013-06-22 DIAGNOSIS — N452 Orchitis: Secondary | ICD-10-CM

## 2013-06-22 LAB — POCT URINALYSIS DIP (DEVICE)
Bilirubin Urine: NEGATIVE
Glucose, UA: NEGATIVE mg/dL
Hgb urine dipstick: NEGATIVE
Ketones, ur: NEGATIVE mg/dL
LEUKOCYTES UA: NEGATIVE
Nitrite: NEGATIVE
PH: 7 (ref 5.0–8.0)
PROTEIN: NEGATIVE mg/dL
Specific Gravity, Urine: 1.02 (ref 1.005–1.030)
UROBILINOGEN UA: 0.2 mg/dL (ref 0.0–1.0)

## 2013-06-22 MED ORDER — HYDROMORPHONE HCL PF 1 MG/ML IJ SOLN
0.5000 mg | Freq: Once | INTRAMUSCULAR | Status: AC
  Administered 2013-06-22: 0.5 mg via INTRAVENOUS
  Filled 2013-06-22: qty 1

## 2013-06-22 MED ORDER — ONDANSETRON HCL 4 MG/2ML IJ SOLN
4.0000 mg | Freq: Once | INTRAMUSCULAR | Status: AC
  Administered 2013-06-22: 4 mg via INTRAVENOUS
  Filled 2013-06-22: qty 2

## 2013-06-22 MED ORDER — OXYCODONE-ACETAMINOPHEN 5-325 MG PO TABS: 1.0000 | ORAL_TABLET | Freq: Four times a day (QID) | ORAL | Status: AC | PRN

## 2013-06-22 NOTE — Discharge Instructions (Signed)
Follow with Dr. Isabel CapriceGrapey if the pain does not improve.

## 2013-06-22 NOTE — ED Notes (Addendum)
Pt reports having a "twisted testicle."  Pt reports right sided testicle pain and swelling. Pt reports being seen at Urgent Care and was intrusted to go to a urologist office immediately.  Pt did not have insurance and presents to ED. Pt reports that he has testicle pain for one week, which suddenly became worse today at 0700. Pt reports "it feels as if someone is pulling my testicles." Pt is A/O x4 and vital signs are WDL.

## 2013-06-22 NOTE — ED Provider Notes (Signed)
Medical screening examination/treatment/procedure(s) were performed by non-physician practitioner and as supervising physician I was immediately available for consultation/collaboration.  Arian Mcquitty, M.D.   Shaquera Ansley C Makeda Peeks, MD 06/22/13 1804 

## 2013-06-22 NOTE — ED Provider Notes (Addendum)
CSN: [id number]     Arrival date & time 06/22/13  1122 History   First MD Initiated Contact with Patient 06/22/13 1259     Chief Complaint  Patient presents with  . Abdominal Pain   (Consider location/radiation/quality/duration/timing/severity/associated sxs/prior Treatment) HPI Comments: 28 year old male presents complaining of right testicle pain. This has been intermittent for about one month, but became suddenly very severe one week ago and has not let up. The pain radiates up into his abdomen. He is never had pain like this before. He denies any risk of STDs. He denies any nausea or vomiting, low back pain, trouble starting his stream of urine, penile discharge.  Patient is a 28 y.o. male presenting with abdominal pain.  Abdominal Pain Associated symptoms: no chest pain, no chills, no constipation, no cough, no diarrhea, no dysuria, no fatigue, no fever, no hematuria, no nausea, no shortness of breath, no sore throat and no vomiting     Past Medical History  Diagnosis Date  . Anxiety    History reviewed. No pertinent past surgical history. No family history on file. History  Substance Use Topics  . Smoking status: Former Smoker    Quit date: 12/29/2012  . Smokeless tobacco: Not on file  . Alcohol Use: Yes    Review of Systems  Constitutional: Negative for fever, chills and fatigue.  HENT: Negative for sore throat.   Eyes: Negative for visual disturbance.  Respiratory: Negative for cough and shortness of breath.   Cardiovascular: Negative for chest pain, palpitations and leg swelling.  Gastrointestinal: Positive for abdominal pain. Negative for nausea, vomiting, diarrhea and constipation.  Genitourinary: Positive for scrotal swelling and testicular pain. Negative for dysuria, urgency, frequency and hematuria.  Musculoskeletal: Negative for arthralgias, myalgias, neck pain and neck stiffness.  Skin: Negative for rash.  Neurological: Negative for dizziness, weakness and  light-headedness.    Allergies  Review of patient's allergies indicates no known allergies.  Home Medications   Current Outpatient Rx  Name  Route  Sig  Dispense  Refill  . acetaminophen (TYLENOL) 325 MG tablet   Oral   Take 650 mg by mouth every 6 (six) hours as needed for pain.         . cyclobenzaprine (FLEXERIL) 10 MG tablet   Oral   Take 1 tablet (10 mg total) by mouth 3 (three) times daily as needed for muscle spasms.   30 tablet   0   . ibuprofen (ADVIL,MOTRIN) 200 MG tablet   Oral   Take 800 mg by mouth every 6 (six) hours as needed for pain.         Marland Kitchen ibuprofen (ADVIL,MOTRIN) 800 MG tablet   Oral   Take 1 tablet (800 mg total) by mouth 3 (three) times daily.   21 tablet   0   . oxyCODONE-acetaminophen (PERCOCET/ROXICET) 5-325 MG per tablet   Oral   Take 1 tablet by mouth every 4 (four) hours as needed for pain.   20 tablet   0   . traMADol (ULTRAM) 50 MG tablet   Oral   Take 1 tablet (50 mg total) by mouth every 6 (six) hours as needed for pain.   15 tablet   0    BP 121/71  Pulse 64  Temp(Src) 98.3 F (36.8 C) (Oral)  Resp 12  SpO2 98% Physical Exam  Nursing note and vitals reviewed. Constitutional: He is oriented to person, place, and time. He appears well-developed and well-nourished. No distress.  HENT:  Head:  Normocephalic.  Pulmonary/Chest: Effort normal. No respiratory distress.  Abdominal: Normal appearance. There is no hepatosplenomegaly. There is tenderness (mild) in the right lower quadrant. There is no rigidity, no rebound, no guarding, no CVA tenderness, no tenderness at McBurney's point and negative Murphy's sign. No hernia.  Genitourinary: Right testis shows tenderness. Right testis shows no mass and no swelling. Cremasteric reflex is absent on the right side. Cremasteric reflex is absent on the left side.  Negative prehns sign   Lymphadenopathy:       Right: Inguinal adenopathy present.       Left: No inguinal adenopathy  present.  Neurological: He is alert and oriented to person, place, and time. Coordination normal.  Skin: Skin is warm and dry. No rash noted. He is not diaphoretic.  Psychiatric: He has a normal mood and affect. Judgment normal.    ED Course  Procedures (including critical care time) Labs Review Labs Reviewed  POCT URINALYSIS DIP (DEVICE)  URINE CYTOLOGY ANCILLARY ONLY   Imaging Review No results found.    MDM   1. Pain in right testicle    Probable torsion.  Spoke with Dr. Isabel CapriceGrapey is on-call urologist, will see pt in office now.      Graylon GoodZachary H Hamish Banks, PA-C 06/22/13 1407   Urine cytology came back, positive for chlamydia.  Minocycline sent in to pharmacy.  Pt informed.     Meds ordered this encounter  Medications  . minocycline (MINOCIN) 100 MG capsule    Sig: Take 1 capsule (100 mg total) by mouth 2 (two) times daily.    Dispense:  20 capsule    Refill:  0    Order Specific Question:  Supervising Provider    Answer:  Bradd CanaryKINDL, JAMES D [[id number]]     Graylon GoodZachary H Ermalee Mealy, PA-C 06/23/13 1536

## 2013-06-22 NOTE — ED Provider Notes (Signed)
CSN: [id number]     Arrival date & time 06/22/13  1529 History   First MD Initiated Contact with Patient 06/22/13 1546     Chief Complaint  Patient presents with  . Testicle Pain   (Consider location/radiation/quality/duration/timing/severity/associated sxs/prior Treatment) Patient is a 28 y.o. male presenting with testicular pain. The history is provided by the patient.  Testicle Pain Pertinent negatives include no chest pain, no abdominal pain, no headaches and no shortness of breath.   patient was transferred from urgent care with testicle pain. Worry of torsion. His been having pain for a month on and off worse the last few days. He was post to go to the urology office, however he went over there in the computer system was down she was sent to the ED. No dysuria. The pain is worse with movement. No trauma. No fevers. He has not had episodes like this before.  Past Medical History  Diagnosis Date  . Anxiety    History reviewed. No pertinent past surgical history. No family history on file. History  Substance Use Topics  . Smoking status: Former Smoker    Quit date: 12/29/2012  . Smokeless tobacco: Not on file  . Alcohol Use: Yes    Review of Systems  Constitutional: Negative for activity change and appetite change.  Eyes: Negative for pain.  Respiratory: Negative for chest tightness and shortness of breath.   Cardiovascular: Negative for chest pain and leg swelling.  Gastrointestinal: Negative for nausea, vomiting, abdominal pain and diarrhea.  Genitourinary: Positive for testicular pain. Negative for flank pain.  Musculoskeletal: Negative for back pain and neck stiffness.  Skin: Negative for rash.  Neurological: Negative for weakness, numbness and headaches.  Psychiatric/Behavioral: Negative for behavioral problems.    Allergies  Review of patient's allergies indicates no known allergies.  Home Medications   Current Outpatient Rx  Name  Route  Sig  Dispense  Refill   . acetaminophen (TYLENOL) 325 MG tablet   Oral   Take 650 mg by mouth every 6 (six) hours as needed for pain.         Marland Kitchen. ibuprofen (ADVIL,MOTRIN) 200 MG tablet   Oral   Take 800 mg by mouth every 6 (six) hours as needed for pain.         Marland Kitchen. oxyCODONE-acetaminophen (PERCOCET/ROXICET) 5-325 MG per tablet   Oral   Take 1-2 tablets by mouth every 6 (six) hours as needed for severe pain.   15 tablet   0    BP 129/72  Pulse 60  Temp(Src) 97.5 F (36.4 C) (Oral)  Resp 20  SpO2 98% Physical Exam  Nursing note and vitals reviewed. Constitutional: He is oriented to person, place, and time. He appears well-developed and well-nourished.  HENT:  Head: Normocephalic and atraumatic.  Eyes: EOM are normal. Pupils are equal, round, and reactive to light.  Neck: Normal range of motion. Neck supple.  Cardiovascular: Normal rate, regular rhythm and normal heart sounds.   No murmur heard. Pulmonary/Chest: Effort normal and breath sounds normal.  Abdominal: Soft. Bowel sounds are normal. He exhibits no distension and no mass. There is no tenderness. There is no rebound and no guarding.  Genitourinary:  Right-sided testicular pain. No swelling. No penile discharge. No CVA tenderness. No Abdominal tenderness.  Musculoskeletal: Normal range of motion. He exhibits no edema.  Neurological: He is alert and oriented to person, place, and time. No cranial nerve deficit.  Skin: Skin is warm and dry.  Psychiatric: He has a  normal mood and affect.    ED Course  Procedures (including critical care time) Labs Review Labs Reviewed - No data to display Imaging Review US Scrotum  06/22/2013   CLINICAL DATA:  Testicular pain.  EXAM: SCROTAL ULTRASOUND  DOPPLER ULTRASOUND OF THE TESTICLES  TECHNIQUE: Complete ultrasound examination of the testicles, epididymis, and other scrotal structures was performed. Color and spectral Doppler ultrasound were also utilized to evaluate blood flow to the testicles.   COMPARISON:  None.  FINDINGS: Measurements: 4.2 x 2.3 x 2.9 cm. No mass or microlithiasis visualized.  Left testicle  Measurements: 3.8 x 2.5 x 2.9 cm. No mass or microlithiasis visualized.  Right epididymis:  2 mm epididymal cyst.  Left epididymis:  Normal in size and appearance.  Hydrocele: Small bilateral hydroceles, right side greater than left .  Pulsed Doppler interrogation of both testes demonstrates low resistance arterial and venous waveforms bilaterally.  IMPRESSION: 1. No evidence of torsion. 2. Small bilateral hydroceles, right side greater than left.   Electronically Signed   By: Maisie Fus  Register   On: 06/22/2013 17:10   [place] Art/ven Flow Abd Pelv Doppler  06/22/2013   CLINICAL DATA:  Testicular pain.  EXAM: SCROTAL ULTRASOUND  DOPPLER ULTRASOUND OF THE TESTICLES  TECHNIQUE: Complete ultrasound examination of the testicles, epididymis, and other scrotal structures was performed. Color and spectral Doppler ultrasound were also utilized to evaluate blood flow to the testicles.  COMPARISON:  None.  FINDINGS: Measurements: 4.2 x 2.3 x 2.9 cm. No mass or microlithiasis visualized.  Left testicle  Measurements: 3.8 x 2.5 x 2.9 cm. No mass or microlithiasis visualized.  Right epididymis:  2 mm epididymal cyst.  Left epididymis:  Normal in size and appearance.  Hydrocele: Small bilateral hydroceles, right side greater than left .  Pulsed Doppler interrogation of both testes demonstrates low resistance arterial and venous waveforms bilaterally.  IMPRESSION: 1. No evidence of torsion. 2. Small bilateral hydroceles, right side greater than left.   Electronically Signed   By: Maisie Fus  Register   On: 06/22/2013 17:10    EKG Interpretation   None       MDM   1. Testicle pain    Patient with testicle pain last month. Worse last couple days. Has only mild hydroceles. Urine was clean. No abdominal tenderness. No hematuria. We'll give pain medicine and have follow with urology.    Juliet Rude. Rubin Payor,  MD 06/22/13 1840

## 2013-06-22 NOTE — ED Notes (Signed)
Pt c/o lower abd pain onset 1 week Sxs also include: soreness on right teste; pain increases when he lifrts Denies: urinary sxs, fevers Alert w/no signs of acute distress.

## 2013-06-23 ENCOUNTER — Telehealth (HOSPITAL_COMMUNITY): Payer: Self-pay | Admitting: *Deleted

## 2013-06-23 MED ORDER — MINOCYCLINE HCL 100 MG PO CAPS: 100.0000 mg | ORAL_CAPSULE | Freq: Two times a day (BID) | ORAL | Status: AC

## 2013-06-23 NOTE — ED Notes (Signed)
Pt. verified.  Pt. said he already got his results.  Given STD instructions (Pt. instructed to notify his partner, no sex for 1 week and to practice safe sex. Pt. told he can get HIV testing at [facility], by appointment.) [name], Seth Davis 06/23/2013  .

## 2013-06-23 NOTE — ED Notes (Signed)
GC and Trich neg., Chlamydia pos.  Almedia BallsZach Baker PA said he reviewed the lab and e-prescribed pt. [facility] on [address] He also called and notified pt. of his result.   [facility]HS form [facility]. [name], Jasdeep Kepner M 06/23/2013

## 2013-06-24 NOTE — ED Provider Notes (Signed)
Medical screening examination/treatment/procedure(s) were performed by non-physician practitioner and as supervising physician I was immediately available for consultation/collaboration.  Leslee Homeavid Onedia Vargus, M.D.   Reuben Likesavid C Ian Castagna, MD 06/24/13 0900

## 2013-12-28 IMAGING — CT CT HEAD W/O CM
1 of 2 series · 16 of 30 positions shown, 20 images · non-contrast
Comparison: None.

CLINICAL DATA: Headaches and dizziness.

CT HEAD WITHOUT CONTRAST
TECHNIQUE: Contiguous axial images were obtained from the base of
the skull through the vertex without contrast.

[Series 2: head routine 4.8 h37s · axial · 0.45mm/px · z∈[-119,+5]mm · 16 of 30 slices shown, 20 images]
[im 2/30  brain]
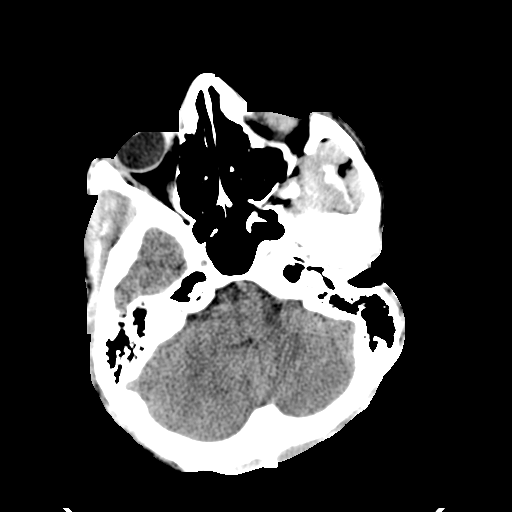
[im 2/30  bone]
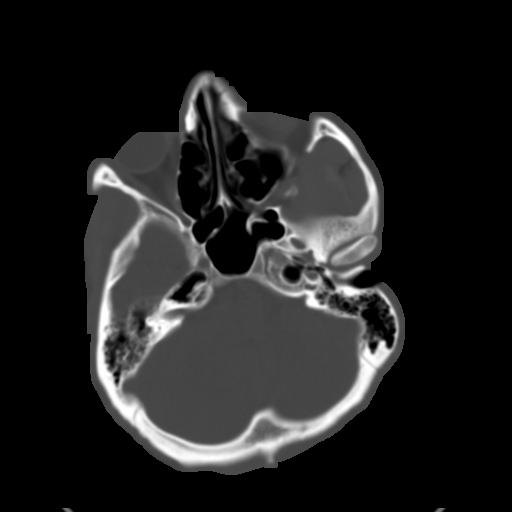
[im 3/30  brain]
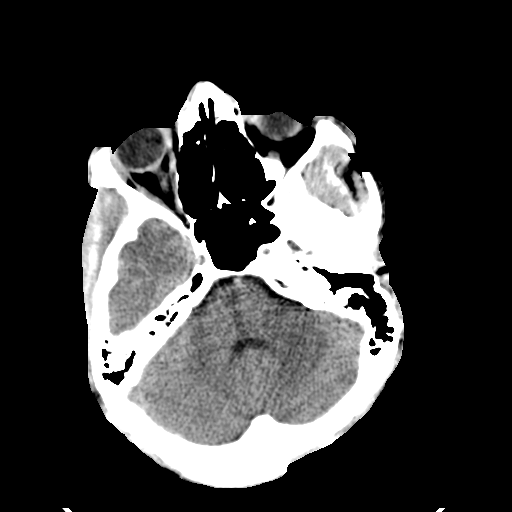
[im 5/30  brain]
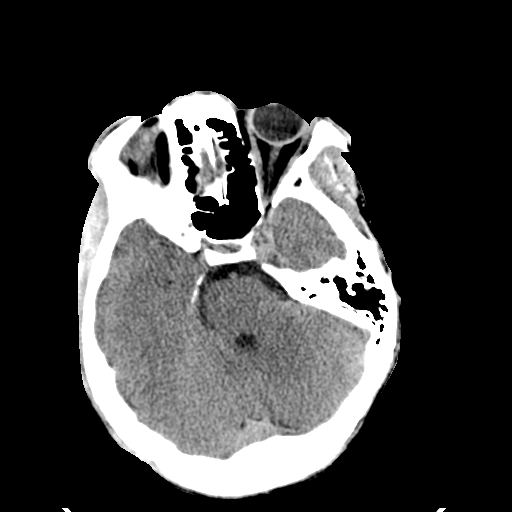
[im 8/30  brain]
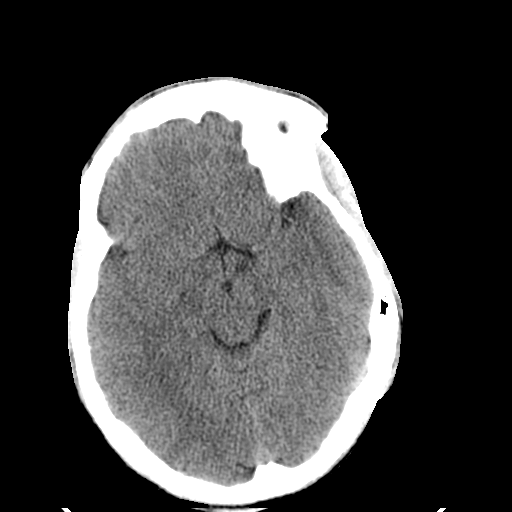
[im 9/30  brain]
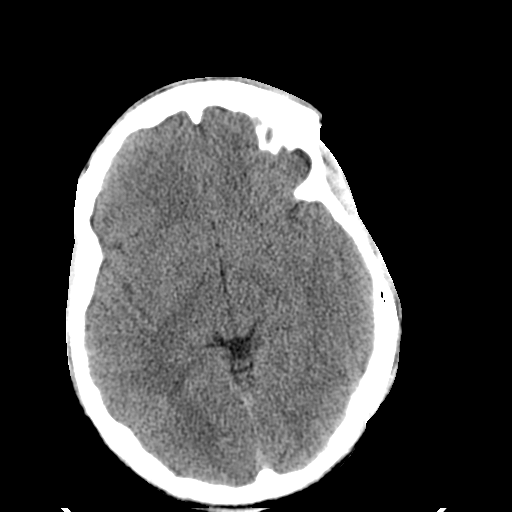
[im 9/30  bone]
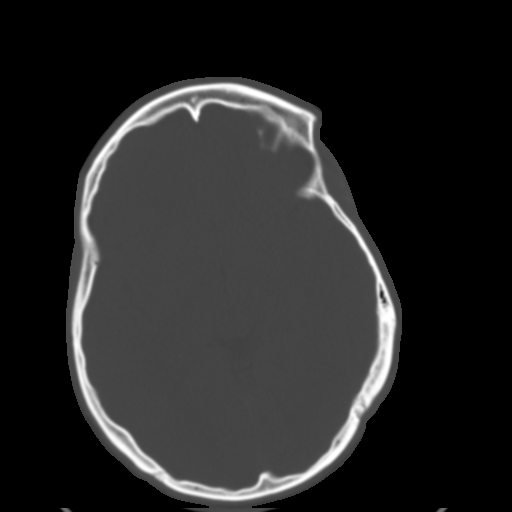
[im 11/30  brain]
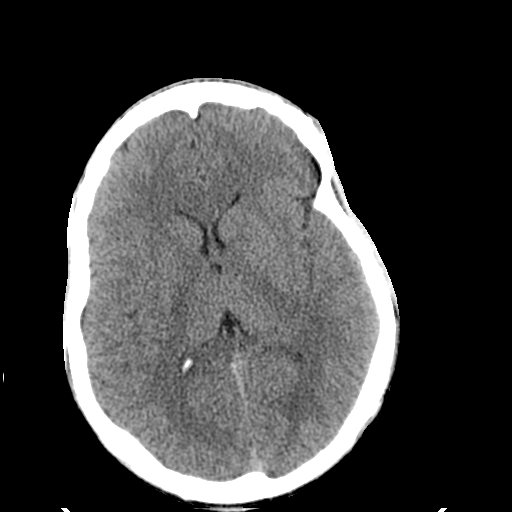
[im 12/30  brain]
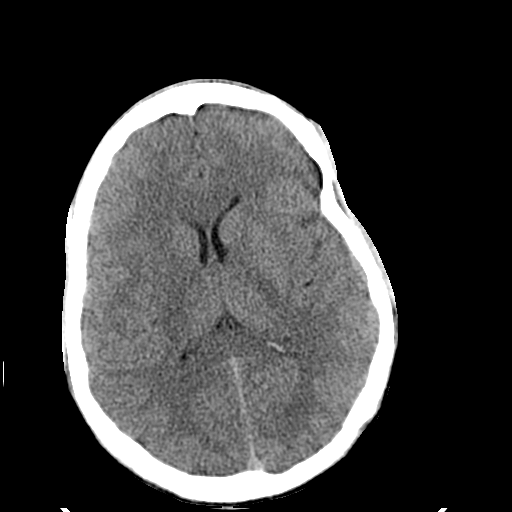
[im 14/30  brain]
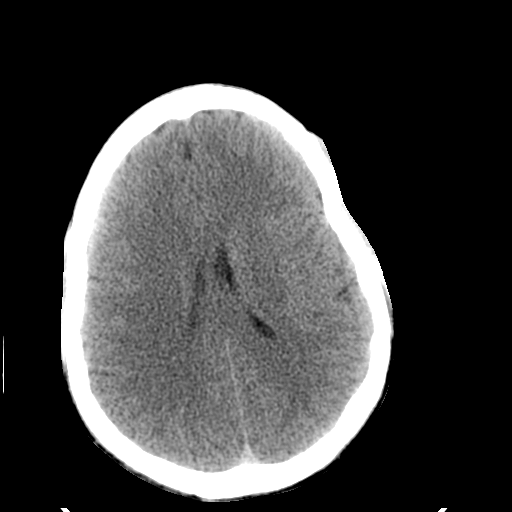
[im 16/30  brain]
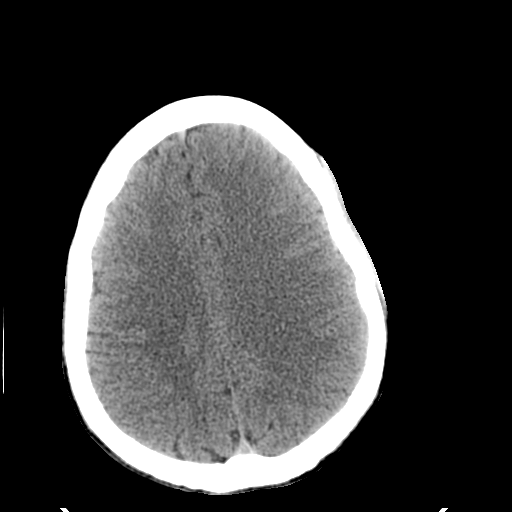
[im 16/30  bone]
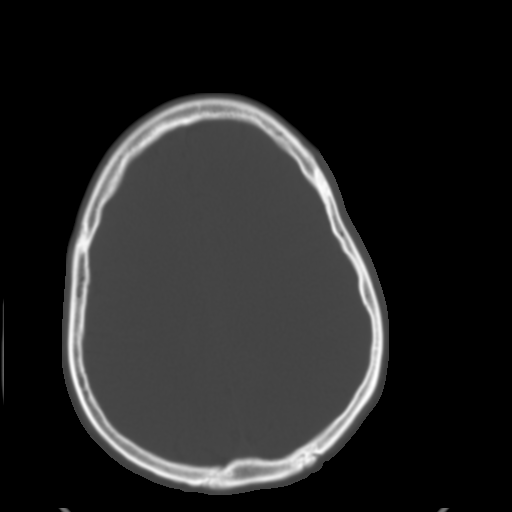
[im 18/30  brain]
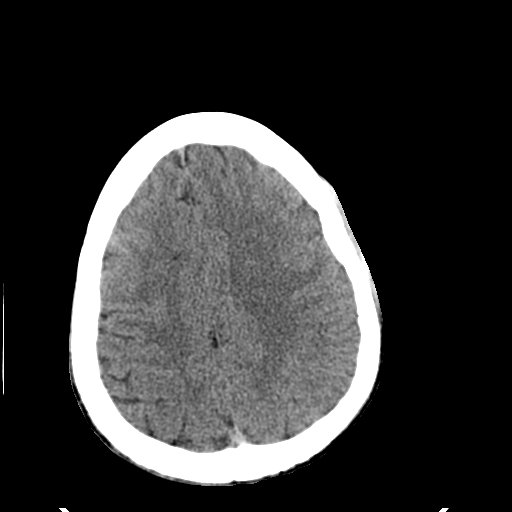
[im 19/30  brain]
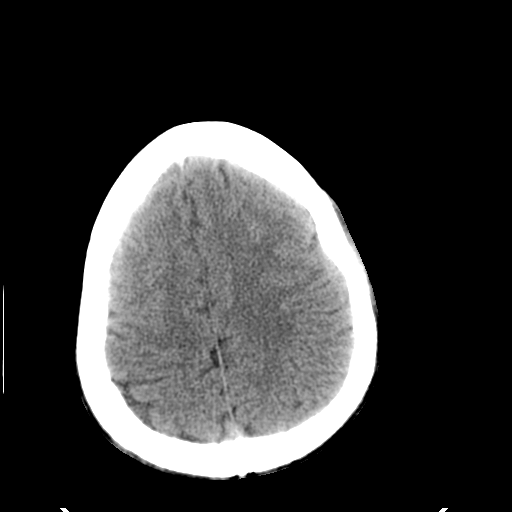
[im 21/30  brain]
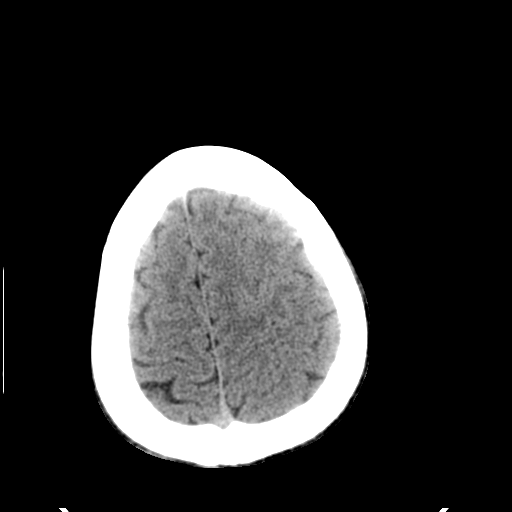
[im 22/30  brain]
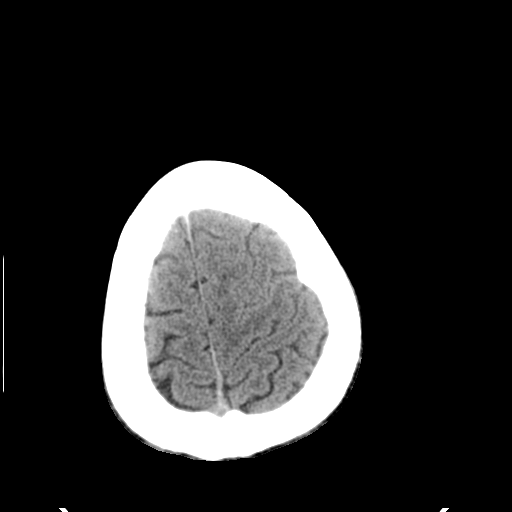
[im 22/30  bone]
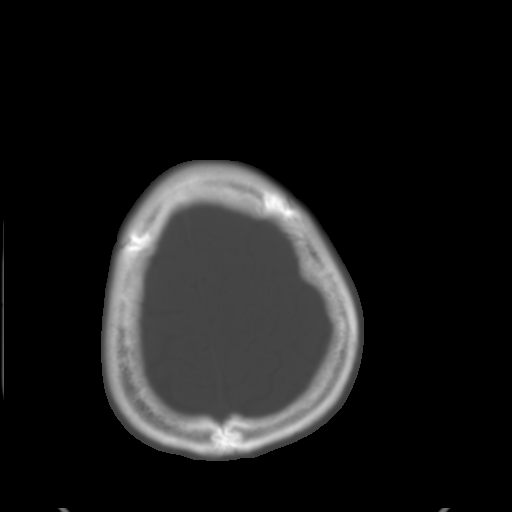
[im 25/30  brain]
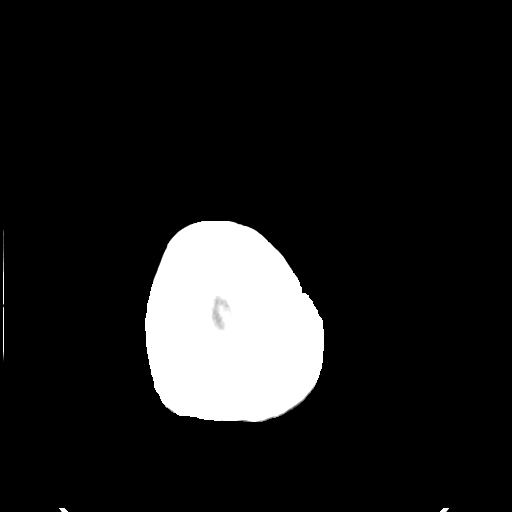
[im 27/30  brain]
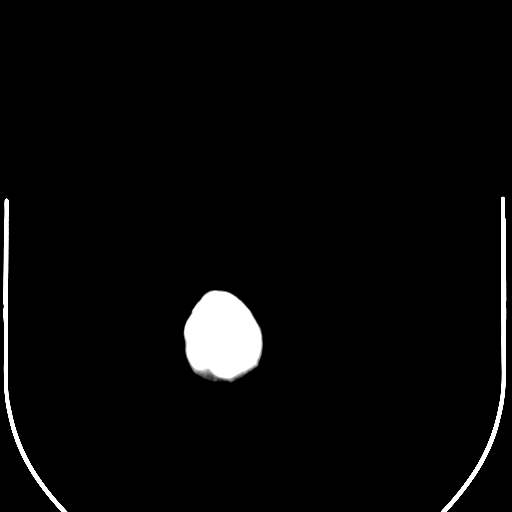
[im 28/30  brain]
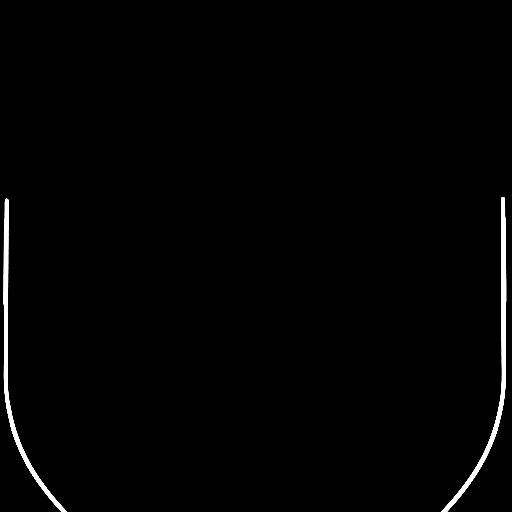

[16 of 30 positions shown; findings below may reference images not displayed]

FINDINGS: No acute infarct, hemorrhage, or mass lesion is present.
The ventricles are normal size.  No significant extra-axial fluid
collection is present.

The paranasal sinuses and mastoid air cells are clear.  The osseous
skull is intact.
IMPRESSION: Negative CT of the head.

## 2014-11-01 IMAGING — US US ART/VEN ABD/PELV/SCROTUM DOPPLER LTD
1 series · 14 of 25 positions shown · non-contrast
Comparison: None.

CLINICAL DATA: Testicular pain.

EXAM:
SCROTAL ULTRASOUND
DOPPLER ULTRASOUND OF THE TESTICLES
TECHNIQUE: Complete ultrasound examination of the testicles, epididymis, and
other scrotal structures was performed. Color and spectral Doppler
ultrasound were also utilized to evaluate blood flow to the
testicles.

[Series 1: us art/ven abd/pelv/scrotum doppler ltd · 0.05mm/px · 14 of 48 slices shown]
[im 1/48]
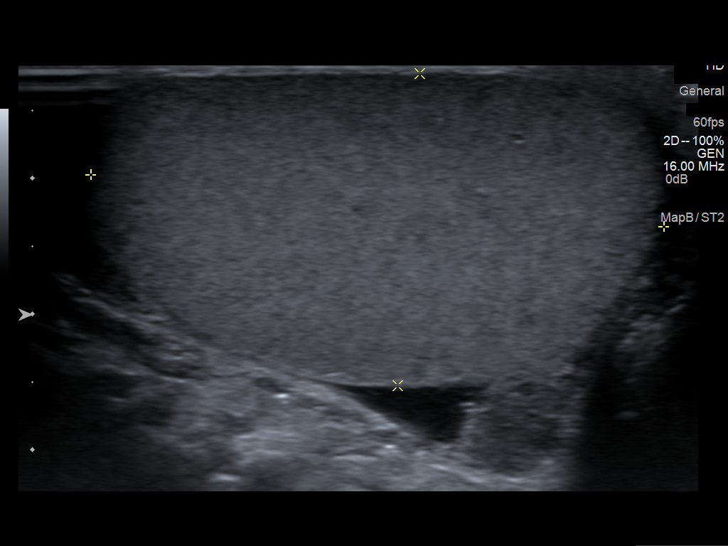
[im 4/48]
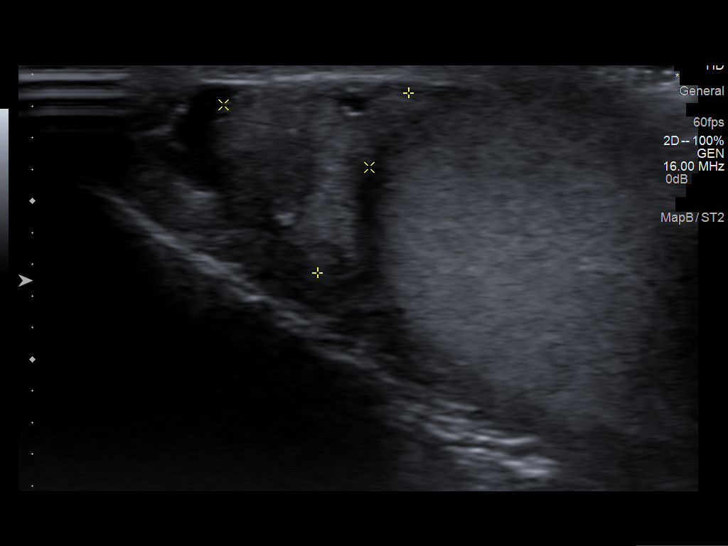
[im 8/48]
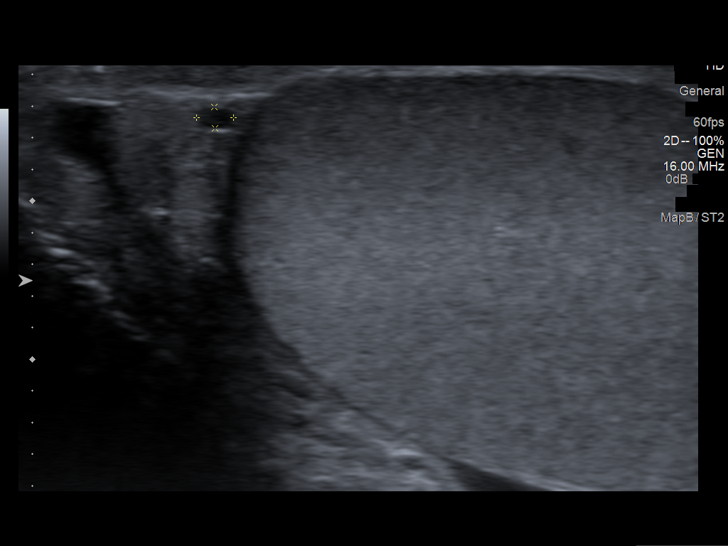
[im 12/48]
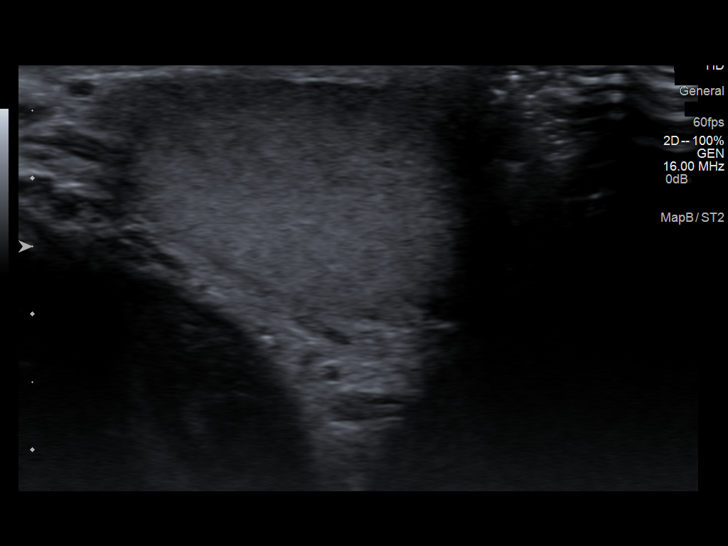
[im 16/48]
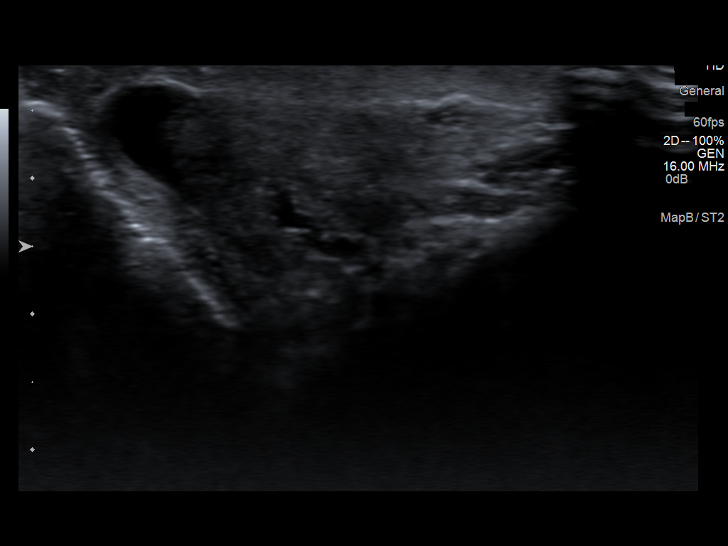
[im 18/48]
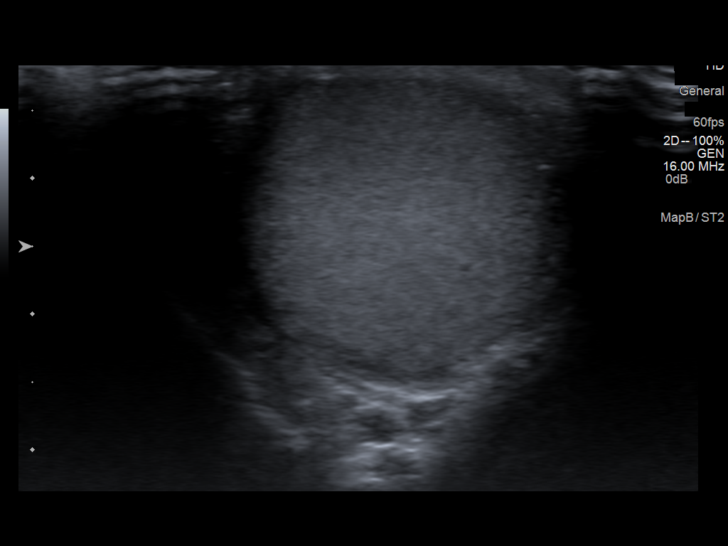
[im 22/48]
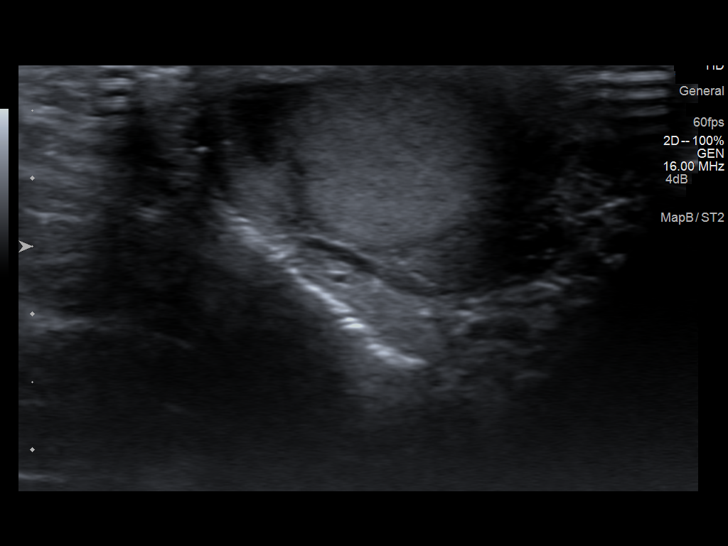
[im 26/48]
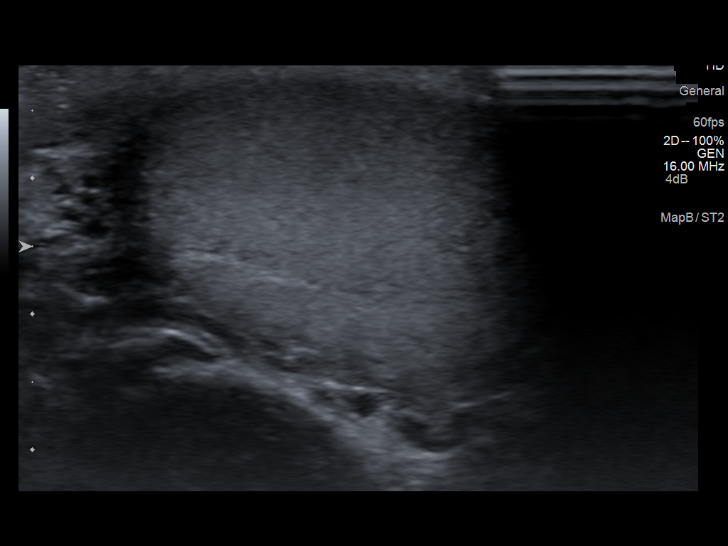
[im 30/48]
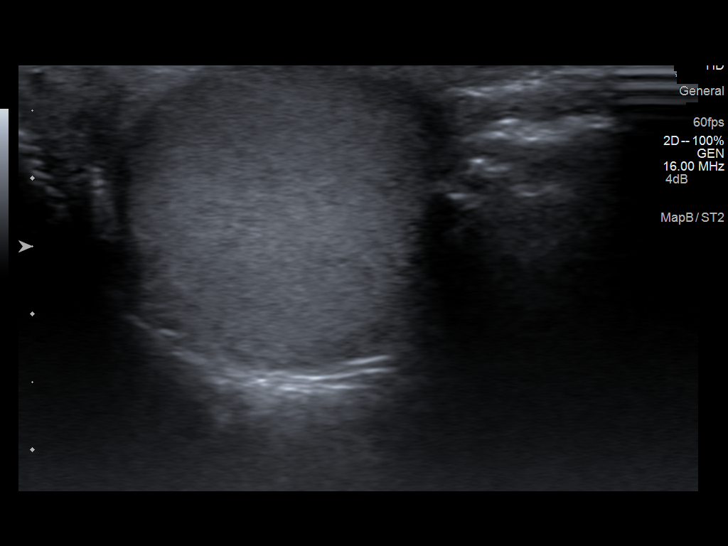
[im 32/48]
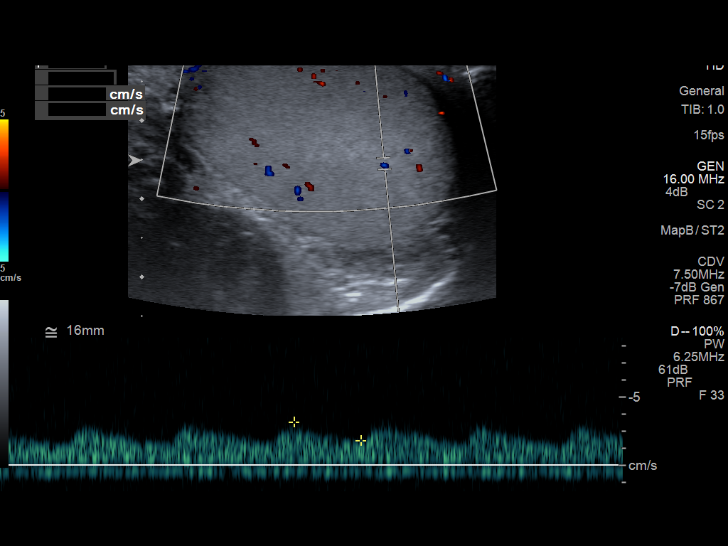
[im 36/48]
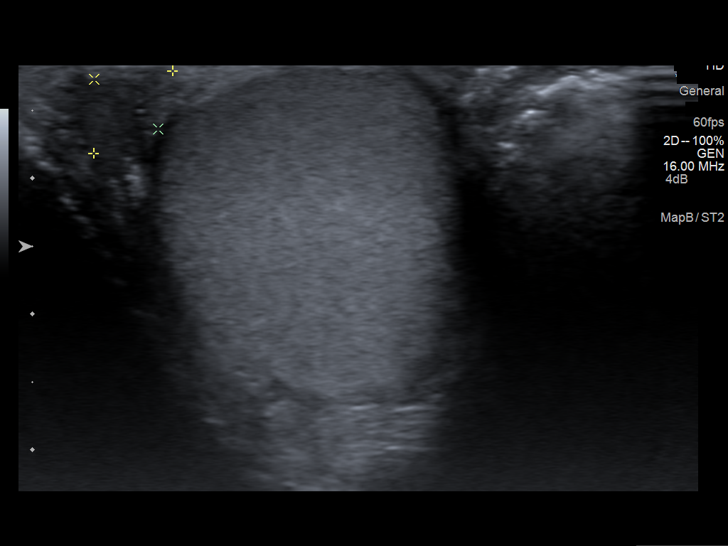
[im 40/48]
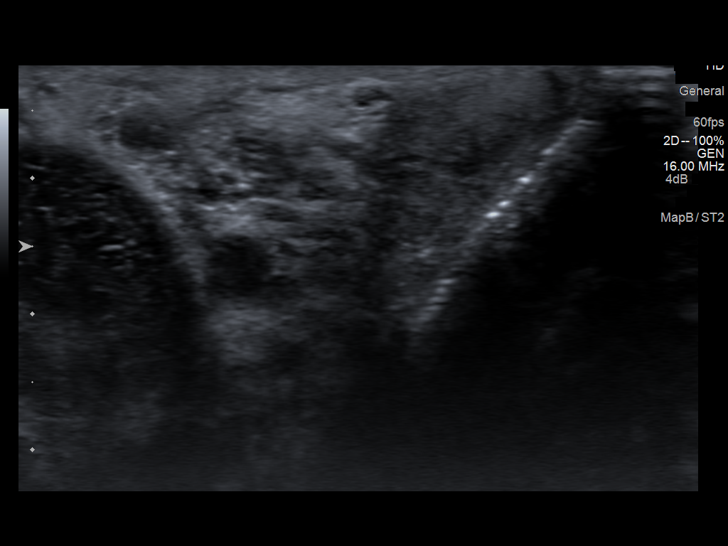
[im 44/48]
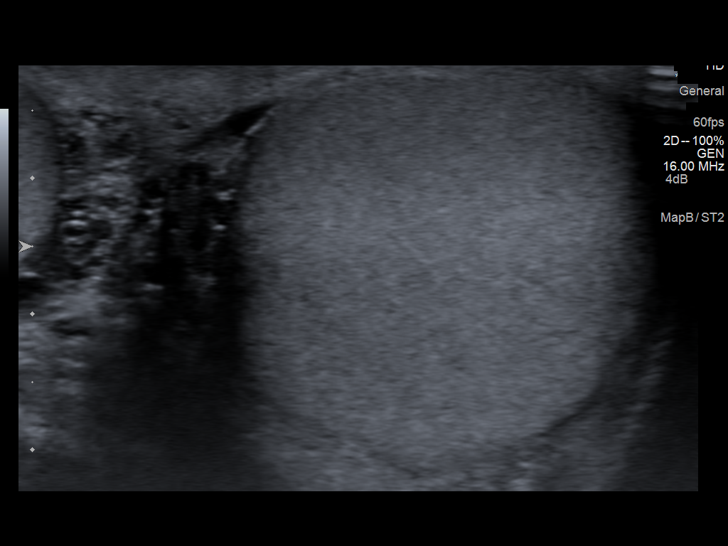
[im 48/48]
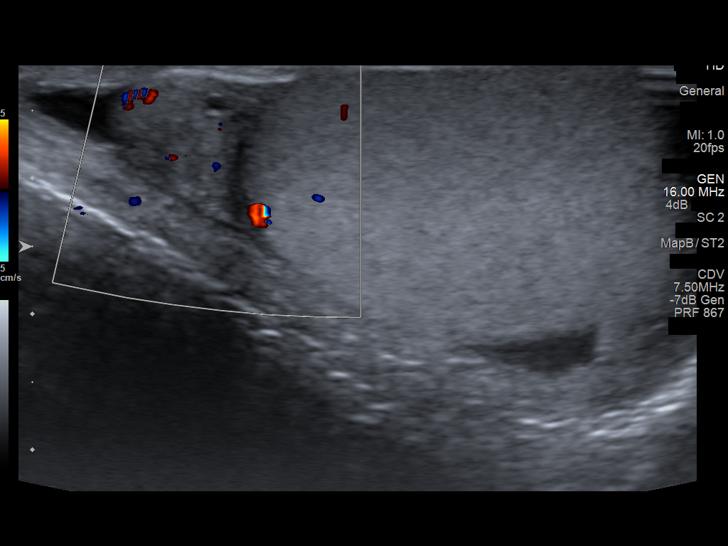

[14 of 25 positions shown; findings below may reference images not displayed]

FINDINGS: Measurements: 4.2 x 2.3 x 2.9 cm. No mass or microlithiasis
visualized.

Left testicle

Measurements: 3.8 x 2.5 x 2.9 cm. No mass or microlithiasis
visualized.

Right epididymis:  2 mm epididymal cyst.

Left epididymis:  Normal in size and appearance.

Hydrocele: Small bilateral hydroceles, right side greater than left
.

Pulsed Doppler interrogation of both testes demonstrates low
resistance arterial and venous waveforms bilaterally.
IMPRESSION: 1. No evidence of torsion.
2. Small bilateral hydroceles, right side greater than left.
